# Patient Record
Sex: Male | Born: 1995 | Race: White | Hispanic: No | Marital: Single | Smoking: Never smoker
Health system: Southern US, Community
[De-identification: ages and names within clinical notes are randomized; demographics above are authoritative.]

## PROBLEM LIST (undated history)

## (undated) HISTORY — PX: OTHER SURGICAL HISTORY: SHX169

## (undated) HISTORY — PX: LEG SURGERY: SHX1003

## (undated) HISTORY — PX: HIP SURGERY: SHX245

## (undated) HISTORY — PX: KNEE SURGERY: SHX244

## (undated) HISTORY — PX: WRIST SURGERY: SHX841

---
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
## 2018-09-02 ENCOUNTER — Encounter (HOSPITAL_BASED_OUTPATIENT_CLINIC_OR_DEPARTMENT_OTHER): Payer: Self-pay | Admitting: *Deleted

## 2018-09-02 ENCOUNTER — Other Ambulatory Visit: Payer: Self-pay

## 2018-09-02 ENCOUNTER — Emergency Department (HOSPITAL_BASED_OUTPATIENT_CLINIC_OR_DEPARTMENT_OTHER)
Admission: EM | Admit: 2018-09-02 | Discharge: 2018-09-03 | Disposition: A | Discharge status: Home / Self Care | Payer: Federal, State, Local not specified - PPO | Attending: Emergency Medicine | Admitting: Emergency Medicine

## 2018-09-02 DIAGNOSIS — G8918 Other acute postprocedural pain: Secondary | ICD-10-CM | POA: Insufficient documentation

## 2018-09-02 DIAGNOSIS — M79605 Pain in left leg: Secondary | ICD-10-CM | POA: Diagnosis present

## 2018-09-02 LAB — COMPREHENSIVE METABOLIC PANEL
ALK PHOS: 53 U/L (ref 38–126)
ALT: 16 U/L (ref 0–44)
AST: 23 U/L (ref 15–41)
Albumin: 3.6 g/dL (ref 3.5–5.0)
Anion gap: 6 (ref 5–15)
BILIRUBIN TOTAL: 0.4 mg/dL (ref 0.3–1.2)
BUN: 17 mg/dL (ref 6–20)
CO2: 26 mmol/L (ref 22–32)
CREATININE: 1.02 mg/dL (ref 0.61–1.24)
Calcium: 8.4 mg/dL — ABNORMAL LOW (ref 8.9–10.3)
Chloride: 104 mmol/L (ref 98–111)
GFR calc Af Amer: >60 mL/min (ref >60)
GFR calc non Af Amer: >60 mL/min (ref >60)
GLUCOSE: 118 mg/dL — AB (ref 70–99)
POTASSIUM: 3.9 mmol/L (ref 3.5–5.1)
Sodium: 136 mmol/L (ref 135–145)
TOTAL PROTEIN: 6 g/dL — AB (ref 6.5–8.1)

## 2018-09-02 LAB — CBC WITH DIFFERENTIAL/PLATELET
Abs Immature Granulocytes: 0.02 K/uL (ref 0.00–0.07)
Basophils Absolute: 0 K/uL (ref 0.0–0.1)
Basophils Relative: 0 %
Eosinophils Absolute: 0.1 K/uL (ref 0.0–0.5)
Eosinophils Relative: 1 %
HCT: 43.3 % (ref 39.0–52.0)
Hemoglobin: 14.6 g/dL (ref 13.0–17.0)
Immature Granulocytes: 0 %
Lymphocytes Relative: 30 %
Lymphs Abs: 2.5 K/uL (ref 0.7–4.0)
MCH: 30 pg (ref 26.0–34.0)
MCHC: 33.7 g/dL (ref 30.0–36.0)
MCV: 89.1 fL (ref 80.0–100.0)
Monocytes Absolute: 0.7 K/uL (ref 0.1–1.0)
Monocytes Relative: 9 %
Neutro Abs: 4.9 K/uL (ref 1.7–7.7)
Neutrophils Relative %: 60 %
Platelets: 212 K/uL (ref 150–400)
RBC: 4.86 MIL/uL (ref 4.22–5.81)
RDW: 12.3 % (ref 11.5–15.5)
WBC: 8.4 K/uL (ref 4.0–10.5)
nRBC: 0 % (ref 0.0–0.2)

## 2018-09-02 MED ORDER — MORPHINE SULFATE (PF) 4 MG/ML IV SOLN
4.0000 mg | Freq: Once | INTRAVENOUS | Status: AC
  Administered 2018-09-02: 4 mg via INTRAVENOUS
  Filled 2018-09-02: qty 1

## 2018-09-02 MED ORDER — ONDANSETRON HCL 4 MG/2ML IJ SOLN
4.0000 mg | Freq: Once | INTRAMUSCULAR | Status: AC
  Administered 2018-09-02: 4 mg via INTRAVENOUS
  Filled 2018-09-02: qty 2

## 2018-09-02 MED ORDER — FENTANYL CITRATE (PF) 100 MCG/2ML IJ SOLN
50.0000 ug | INTRAMUSCULAR | Status: DC | PRN
  Administered 2018-09-02: 50 ug via INTRAVENOUS
  Filled 2018-09-02: qty 2

## 2018-09-02 NOTE — ED Provider Notes (Signed)
[facility] EMERGENCY DEPARTMENT Provider Note   CSN: [id number] Arrival date & time: 09/02/18  2026     History   Chief Complaint Chief Complaint  Patient presents with  . Leg Pain    HPI SAYYID HAREWOOD is a 22 y.o. male.  The history is provided by the patient and a parent.  Leg Pain   This is a new problem. The current episode started 6 to 12 hours ago. The problem occurs constantly. The problem has been rapidly worsening. The pain is present in the left lower leg. The pain is severe. Associated symptoms include numbness. He has tried OTC pain medications (Oxycodone) for the symptoms. The treatment provided no relief.  Patient underwent a compartment release on his left lower leg 1 day ago by Dr. Victorino Dike with orthopedics.  He reports he had compartment syndrome, and had compartment release done.  This morning when he woke up he had significant pain in the leg.  He does report that the nerve block wore off.  Home medications has not improved his symptoms.  He reports numbness to his foot, but no weakness no discoloration. He has spoken to Dr. Victorino Dike who advised ER evaluation.  He has no other complaints  PMH-none Past Surgical History:  Procedure Laterality Date  . HIP SURGERY     Right  . KNEE SURGERY    . LEG SURGERY     Left  . toenails surgery     bilateral great toenail surgery  . WRIST SURGERY     Right        Home Medications    Prior to Admission medications   Medication Sig Start Date End Date Taking? Authorizing Provider  modafinil (PROVIGIL) 200 MG tablet Take 200 mg by mouth daily as needed.   Yes [provider]  OXYCODONE HCL PO Take 5 mg by mouth every 6 (six) hours as needed.   Yes [provider]    Family History History reviewed. No pertinent family history.  Social History Social History   Tobacco Use  . Smoking status: Never Smoker  . Smokeless tobacco: Never Used  Substance Use Topics  . Alcohol use: No    . Drug use: No     Allergies   Patient has no known allergies.   Review of Systems Review of Systems  Constitutional: Negative for fever.  Cardiovascular: Negative for chest pain.  Gastrointestinal: Negative for abdominal pain.  Musculoskeletal: Positive for myalgias.  Neurological: Positive for numbness.  All other systems reviewed and are negative.    Physical Exam Updated Vital Signs BP (!) 148/82 (BP Location: Right Arm)   Pulse 74   Temp 97.8 F (36.6 C)   Resp 20   Ht 1.778 m (5\' 10" )   Wt 81.6 kg   SpO2 100%   BMI 25.83 kg/m   Physical Exam  CONSTITUTIONAL: Well developed/well nourished, anxious HEAD: Normocephalic/atraumatic EYES: EOMI/PERRL ENMT: Mucous membranes moist NECK: supple no meningeal signs SPINE/BACK:entire spine nontender CV: S1/S2 noted, no murmurs/rubs/gallops noted LUNGS: Lungs are clear to auscultation bilaterally, no apparent distress ABDOMEN: soft, nontender, no rebound or guarding, bowel sounds noted throughout abdomen GU:no cva tenderness NEURO: Pt is awake/alert/appropriate, moves all extremitiesx4.  No facial droop.   EXTREMITIES: Distal pulses equal and intact in both feet.  Left foot is warm to touch.  He reports subjective numbness to the dorsal aspect of foot, no numbness to the plantar surface of left foot.  His left calf is soft.  The wounds are clean/dry/intact.  There is no erythematous streaking proximally or distally SKIN: warm, color normal PSYCH: Anxious ED Treatments / Results  Labs (all labs ordered are listed, but only abnormal results are displayed) Labs Reviewed  COMPREHENSIVE METABOLIC PANEL - Abnormal; Notable for the following components:      Result Value   Glucose, Bld 118 (*)    Calcium 8.4 (*)    Total Protein 6.0 (*)    All other components within normal limits  CBC WITH DIFFERENTIAL/PLATELET    EKG None  Radiology No results found.  Procedures Procedures   Medications Ordered in  ED Medications  fentaNYL (SUBLIMAZE) injection 50 mcg (50 mcg Intravenous Given 09/02/18 2207)  ondansetron (ZOFRAN) injection 4 mg (4 mg Intravenous Given 09/02/18 2201)  morphine 4 MG/ML injection 4 mg (4 mg Intravenous Given 09/02/18 2324)  HYDROmorphone (DILAUDID) injection 1 mg (1 mg Intravenous Given 09/03/18 0006)  ondansetron (ZOFRAN) injection 4 mg (4 mg Intravenous Given 09/03/18 0019)  ketorolac (TORADOL) 30 MG/ML injection 30 mg (30 mg Intravenous Given 09/03/18 0026)  cyclobenzaprine (FLEXERIL) tablet 10 mg (10 mg Oral Given 09/03/18 0119)     Initial Impression / Assessment and Plan / ED Course  I have reviewed the triage vital signs and the nursing notes.  Pertinent labs  results that were available during my care of the patient were reviewed by me and considered in my medical decision making (see chart for details).     11:38 PM Discussed the case with Dr. Aundria Rudogers on-call for orthopedics.  He reports that the likelihood of compartment syndrome is low since he just had a compartment release.  His pain likely accelerated due to the nerve block wearing off. He requests prescribing the patient gabapentin 3 times daily, and Toradol p.o. for 5 days.  Dr. Aundria Rudogers will also discuss the case with Dr. Victorino DikeHewitt. 2:08 AM Patient is improved, no acute distress.  Distal pulses intact.  Left foot is warm to touch.  He can move his toes without difficulty.  Will prescribe Toradol/gabapentin per orthopedics request, and patient requests Flexeril for muscle relaxant.  He will follow with his orthopedist in 1 day. Final Clinical Impressions(s) / ED Diagnoses   Final diagnoses:  Left leg pain  Acute post-operative pain    ED Discharge Orders         Ordered    ketorolac (TORADOL) 10 MG tablet  Every 8 hours PRN     09/03/18 0207    gabapentin (NEURONTIN) 100 MG capsule  3 times daily     09/03/18 0207    cyclobenzaprine (FLEXERIL) 10 MG tablet  3 times daily PRN     09/03/18 0207            Zadie RhineWickline, Kaisyn Reinhold, MD 09/03/18 0209

## 2018-09-02 NOTE — ED Notes (Signed)
Cam walker and ACE bandage removed to assess pt's circulation. Sterile bandage left in place. 2+ DP pulse with sensation intact, cap refill <2 sec. Pt reported that the pain increased a after removing ACE. ACE reapplied and rechecked cap refill at <2 sec.

## 2018-09-02 NOTE — ED Notes (Signed)
Patient states took 2 oxycodone at 1800 and repeated x 1 tablet at 1900; took 2 extra strength tylenol at 1800 with no relief; states severe pain onset today around 1400; states took a hydrocodone at 1000 this am as well.
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
## 2018-09-03 MED ORDER — CYCLOBENZAPRINE HCL 10 MG PO TABS
10.0000 mg | ORAL_TABLET | Freq: Once | ORAL | Status: AC
  Administered 2018-09-03: 10 mg via ORAL
  Filled 2018-09-03: qty 1

## 2018-09-03 MED ORDER — HYDROMORPHONE HCL 1 MG/ML IJ SOLN
1.0000 mg | Freq: Once | INTRAMUSCULAR | Status: AC
  Administered 2018-09-03: 1 mg via INTRAVENOUS
  Filled 2018-09-03: qty 1

## 2018-09-03 MED ORDER — ONDANSETRON HCL 4 MG/2ML IJ SOLN
4.0000 mg | Freq: Once | INTRAMUSCULAR | Status: AC
  Administered 2018-09-03: 4 mg via INTRAVENOUS
  Filled 2018-09-03: qty 2

## 2018-09-03 MED ORDER — GABAPENTIN 100 MG PO CAPS: 100.0000 mg | ORAL_CAPSULE | Freq: Three times a day (TID) | ORAL | 0 refills | Status: AC

## 2018-09-03 MED ORDER — CYCLOBENZAPRINE HCL 10 MG PO TABS: 10.0000 mg | ORAL_TABLET | Freq: Three times a day (TID) | ORAL | 0 refills | Status: AC | PRN

## 2018-09-03 MED ORDER — KETOROLAC TROMETHAMINE 30 MG/ML IJ SOLN
30.0000 mg | Freq: Once | INTRAMUSCULAR | Status: AC
  Administered 2018-09-03: 30 mg via INTRAVENOUS
  Filled 2018-09-03: qty 1

## 2018-09-03 MED ORDER — KETOROLAC TROMETHAMINE 10 MG PO TABS: 10.0000 mg | ORAL_TABLET | Freq: Three times a day (TID) | ORAL | 0 refills | Status: AC | PRN

## 2018-09-03 NOTE — ED Notes (Signed)
ED Provider at bedside. 

## 2018-09-03 NOTE — Discharge Instructions (Addendum)
Keep leg elevated Take meds as prescribed If you have worsening pain, discoloration of your foot, or weakness of your foot please return to the ER immediately

## 2022-03-18 ENCOUNTER — Other Ambulatory Visit: Payer: Self-pay | Admitting: Orthopedic Surgery

## 2022-03-18 ENCOUNTER — Ambulatory Visit
Admission: RE | Admit: 2022-03-18 | Discharge: 2022-03-18 | Disposition: A | Discharge status: Home / Self Care | Payer: BC Managed Care – PPO | Source: Ambulatory Visit | Attending: Orthopedic Surgery | Admitting: Orthopedic Surgery

## 2022-03-18 DIAGNOSIS — M25562 Pain in left knee: Secondary | ICD-10-CM

## 2022-11-06 IMAGING — MR MR KNEE*L* W/O CM
5 of 7 series · 22 of 40 positions shown · non-contrast
Comparison: None Available.

CLINICAL DATA: Left knee pain

EXAM:
MRI OF THE LEFT KNEE WITHOUT CONTRAST
TECHNIQUE: Multiplanar, multisequence MR imaging of the knee was performed. No
intravenous contrast was administered.

[Series 3: T2 fat-sat · axial · 4.0mm · 0.50mm/px · z∈[-83,+41]mm · 5 of 26 slices shown (1 of 2)]
[im 1/26]
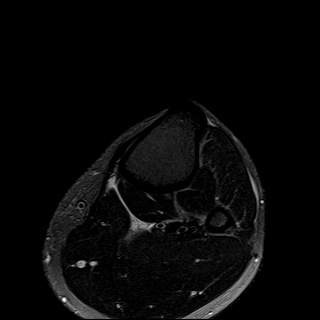
[im 7/26]
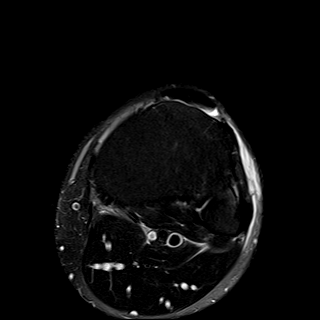
[im 13/26]
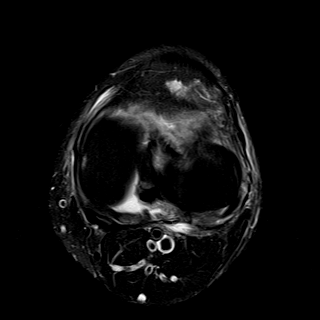
[im 19/26]
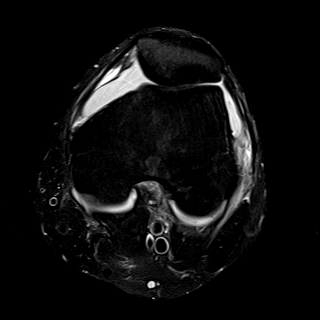
[im 26/26]
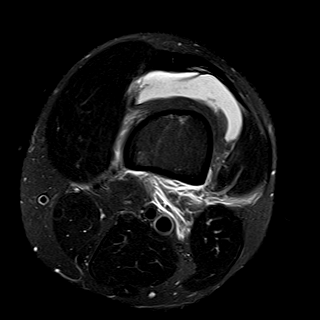

[Series 5: T2 fat-sat · sagittal · 3.0mm · 0.29mm/px · 1 of 28 slices shown (2 of 2)]
[im 1/28]
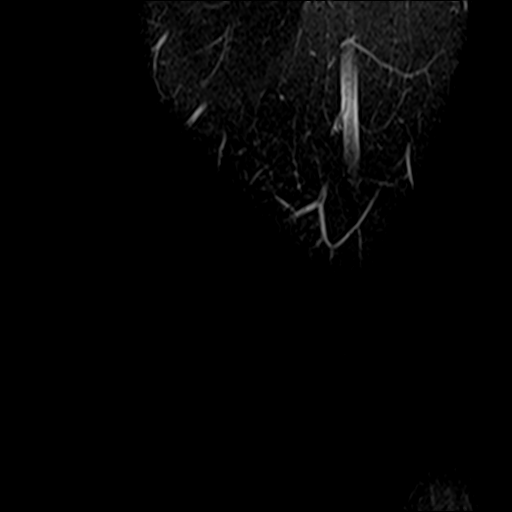

[Series 7: PD fat-sat · sagittal · 3.0mm · 0.29mm/px · 6 of 27 slices shown (1 of 3)]
[im 1/27]
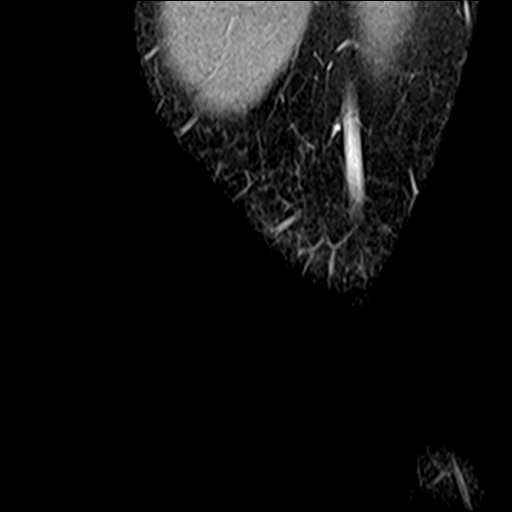
[im 6/27]
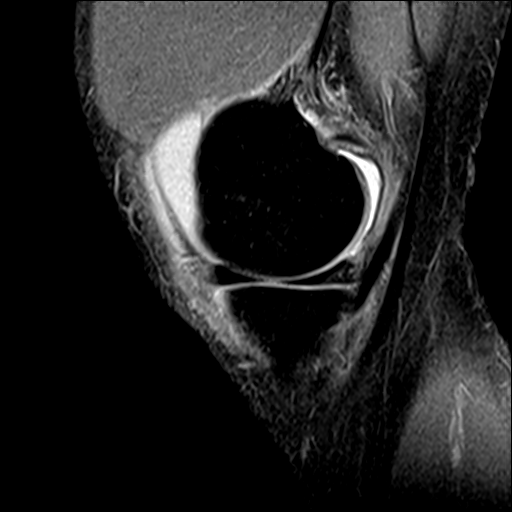
[im 11/27]
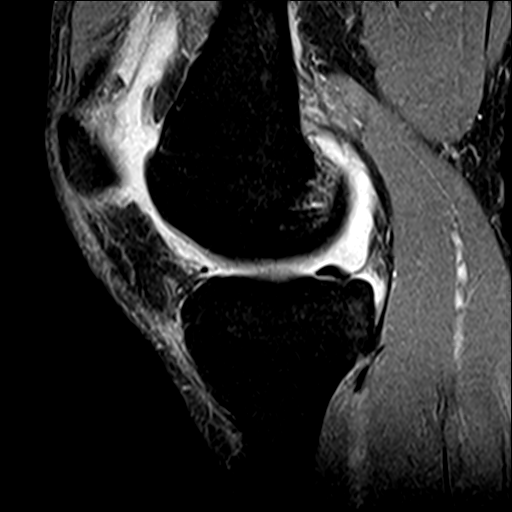
[im 16/27]
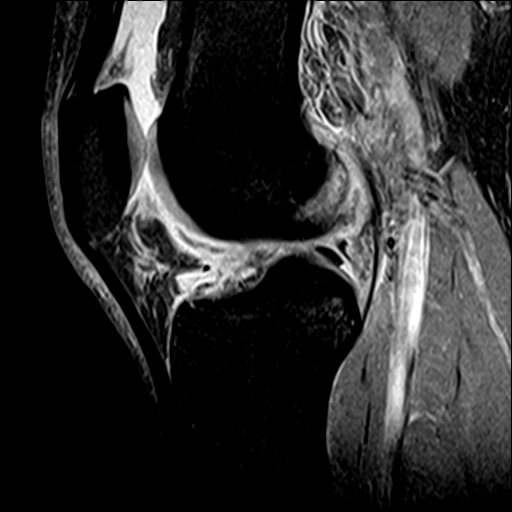
[im 21/27]
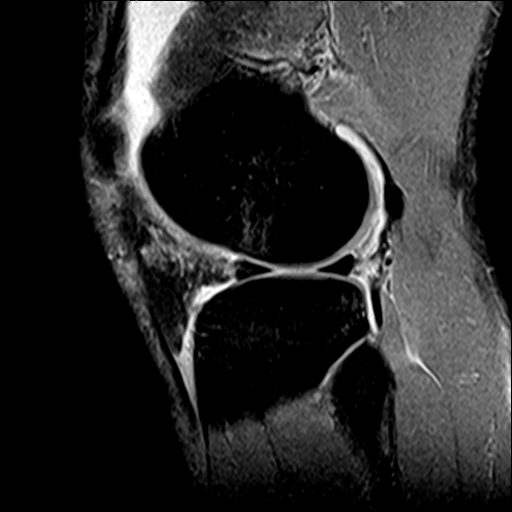
[im 27/27]
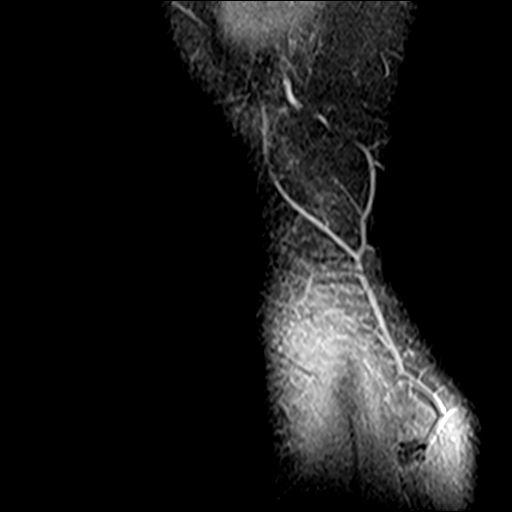

[Series 8: PD fat-sat · coronal · 3.0mm · 0.29mm/px · 7 of 28 slices shown (2 of 3)]
[im 1/28]
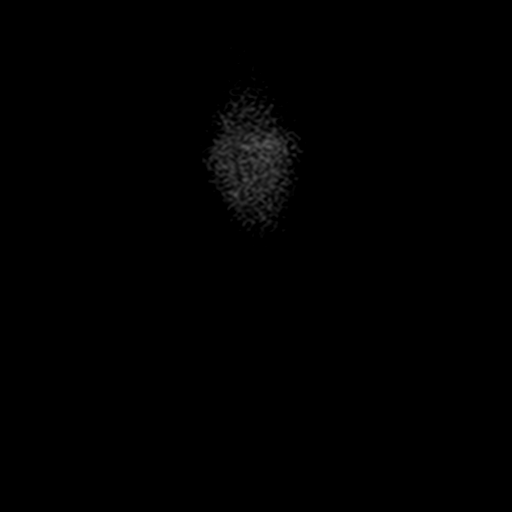
[im 5/28]
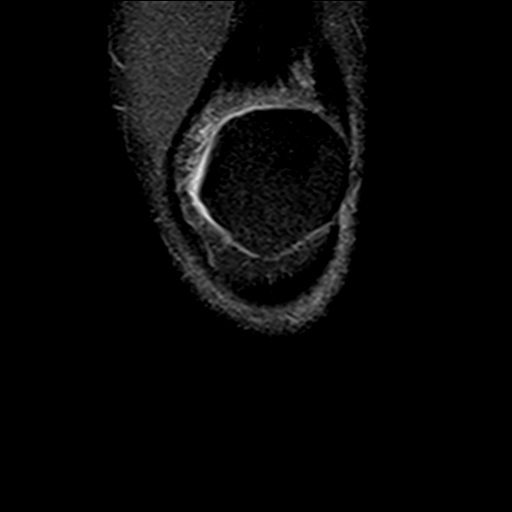
[im 10/28]
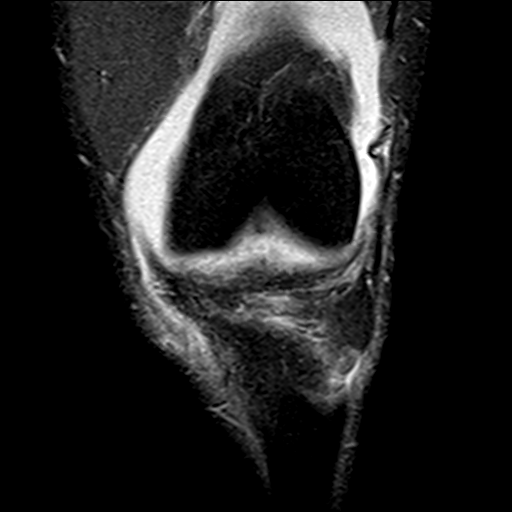
[im 14/28]
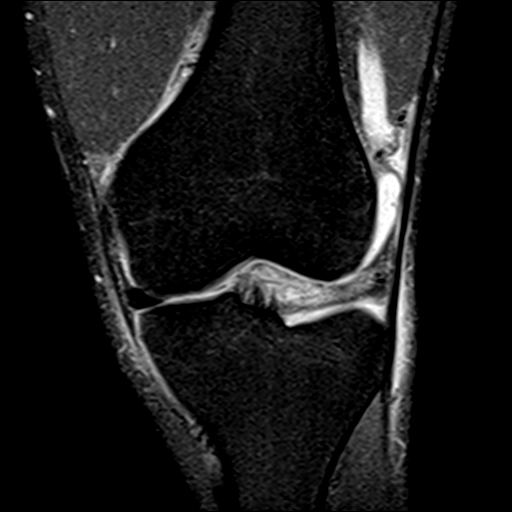
[im 19/28]
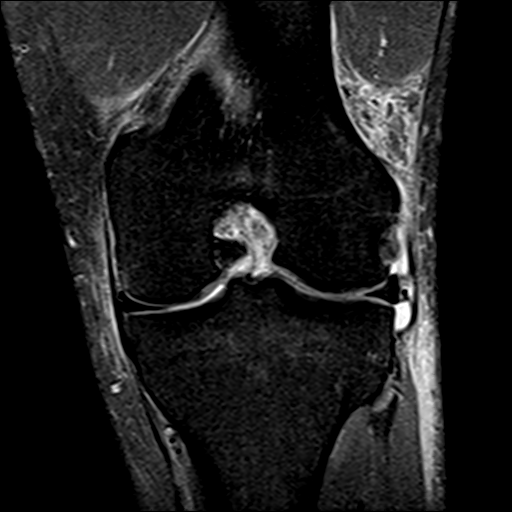
[im 23/28]
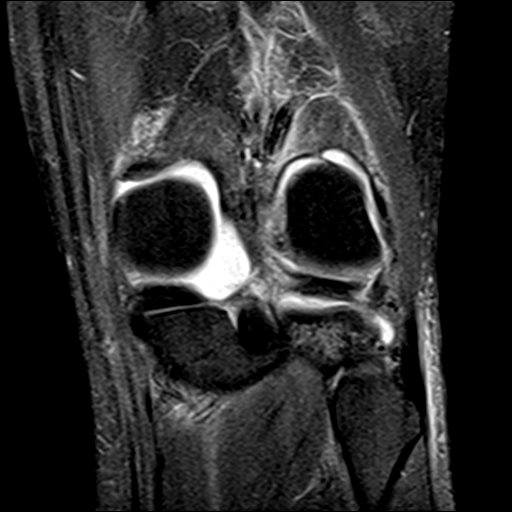
[im 28/28]
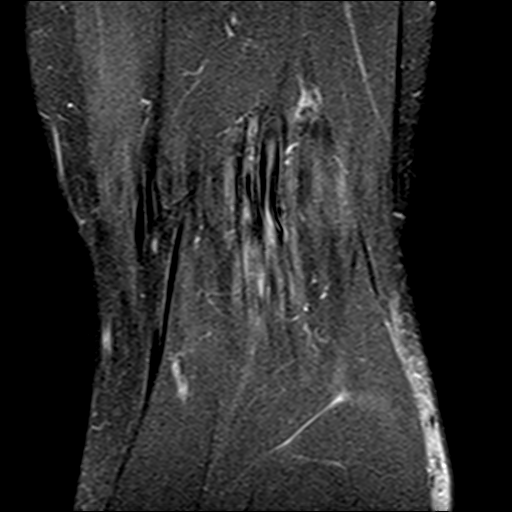

[Series 9: PD fat-sat · oblique · 2.0mm · 0.29mm/px · 3 of 12 slices shown (3 of 3)]
[im 1/12]
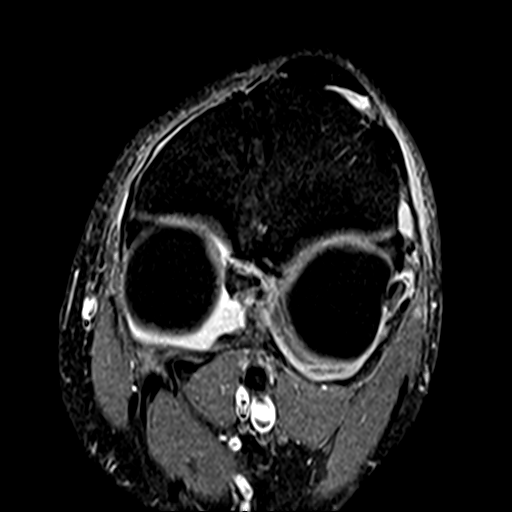
[im 6/12]
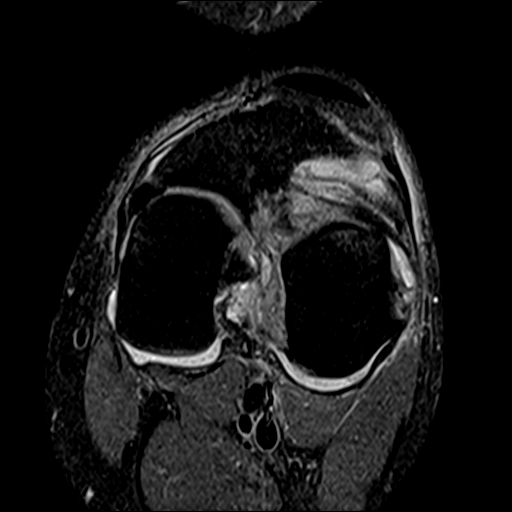
[im 12/12]
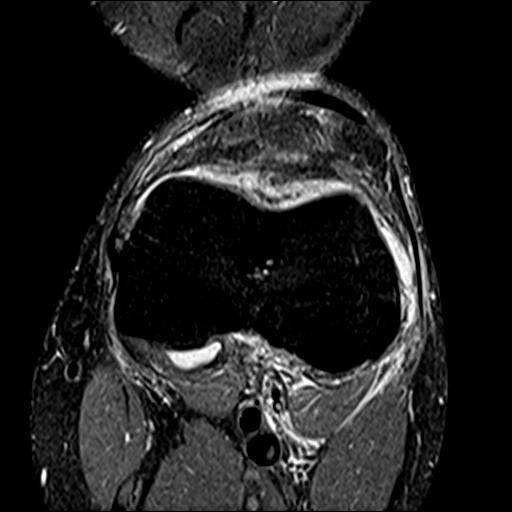

[22 of 40 positions shown; findings below may reference images not displayed]

FINDINGS: MENISCI

Medial: Intact.

Lateral: There is a suspected nondisplaced upper surface horizontal
tear involving the body of the lateral meniscus, anterior aspect
(coronal PD images 11-13).

LIGAMENTS

Cruciates: There is a complete midsubstance ACL tear with pivot
shift bony contusion pattern.

Collaterals: Mild periligamentous edema along the medial collateral
ligament, which is otherwise intact. Lateral collateral ligament
complex is intact.

CARTILAGE

Patellofemoral:  No chondral defect.

Medial:  No chondral defect.

Lateral:  No chondral defect.

JOINT: Large heterogeneous joint effusion/hemarthrosis.

POPLITEAL FOSSA: No Baker's cyst.

EXTENSOR MECHANISM: Intact quadriceps tendon. Intact patellar
tendon.

BONES: Pivot shift bony contusion pattern with edema in the lateral
femoral condyle and posterolateral tibial plateau.

Other: No other findings.
IMPRESSION: Complete midsubstance ACL tear with pivot shift bony contusion
pattern.

Suspected nondisplaced upper surface horizontal tear involving the
body of the lateral meniscus.

Grade 1 MCL sprain.

Large joint effusion/hemarthrosis.
